# Patient Record
Sex: Male | Born: 1980 | Race: Black or African American | Hispanic: No | Marital: Married | Smoking: Current every day smoker
Health system: Southern US, Community
[De-identification: ages and names within clinical notes are randomized; demographics above are authoritative.]

---

## 2012-06-14 ENCOUNTER — Emergency Department (HOSPITAL_COMMUNITY)
Admission: EM | Admit: 2012-06-14 | Discharge: 2012-06-14 | Disposition: A | Discharge status: Home / Self Care | Payer: Self-pay | Attending: Emergency Medicine | Admitting: Emergency Medicine

## 2012-06-14 ENCOUNTER — Encounter (HOSPITAL_COMMUNITY): Payer: Self-pay | Admitting: Emergency Medicine

## 2012-06-14 DIAGNOSIS — S46909A Unspecified injury of unspecified muscle, fascia and tendon at shoulder and upper arm level, unspecified arm, initial encounter: Secondary | ICD-10-CM | POA: Insufficient documentation

## 2012-06-14 DIAGNOSIS — S4980XA Other specified injuries of shoulder and upper arm, unspecified arm, initial encounter: Secondary | ICD-10-CM | POA: Insufficient documentation

## 2012-06-14 DIAGNOSIS — Y9269 Other specified industrial and construction area as the place of occurrence of the external cause: Secondary | ICD-10-CM | POA: Insufficient documentation

## 2012-06-14 DIAGNOSIS — S20229A Contusion of unspecified back wall of thorax, initial encounter: Secondary | ICD-10-CM | POA: Insufficient documentation

## 2012-06-14 DIAGNOSIS — W1809XA Striking against other object with subsequent fall, initial encounter: Secondary | ICD-10-CM | POA: Insufficient documentation

## 2012-06-14 DIAGNOSIS — R51 Headache: Secondary | ICD-10-CM

## 2012-06-14 DIAGNOSIS — Y9389 Activity, other specified: Secondary | ICD-10-CM | POA: Insufficient documentation

## 2012-06-14 DIAGNOSIS — F17200 Nicotine dependence, unspecified, uncomplicated: Secondary | ICD-10-CM | POA: Insufficient documentation

## 2012-06-14 DIAGNOSIS — S20419A Abrasion of unspecified back wall of thorax, initial encounter: Secondary | ICD-10-CM

## 2012-06-14 DIAGNOSIS — S0990XA Unspecified injury of head, initial encounter: Secondary | ICD-10-CM | POA: Insufficient documentation

## 2012-06-14 DIAGNOSIS — IMO0002 Reserved for concepts with insufficient information to code with codable children: Secondary | ICD-10-CM | POA: Insufficient documentation

## 2012-06-14 DIAGNOSIS — G44309 Post-traumatic headache, unspecified, not intractable: Secondary | ICD-10-CM | POA: Insufficient documentation

## 2012-06-14 MED ORDER — IBUPROFEN 800 MG PO TABS: 800.0000 mg | ORAL_TABLET | Freq: Once | ORAL | Status: DC

## 2012-06-14 MED ORDER — IBUPROFEN 400 MG PO TABS
800.0000 mg | ORAL_TABLET | Freq: Once | ORAL | Status: AC
  Administered 2012-06-14: 800 mg via ORAL
  Filled 2012-06-14: qty 2

## 2012-06-14 MED ORDER — HYDROCODONE-ACETAMINOPHEN 5-325 MG PO TABS: 2.0000 | ORAL_TABLET | Freq: Once | ORAL | Status: DC

## 2012-06-14 MED ORDER — HYDROCODONE-ACETAMINOPHEN 5-325 MG PO TABS
2.0000 | ORAL_TABLET | Freq: Once | ORAL | Status: AC
  Administered 2012-06-14: 2 via ORAL
  Filled 2012-06-14: qty 2
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
## 2012-06-14 NOTE — ED Notes (Signed)
Discharge instructions reviewed. Pt verbalized understanding.  
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
## 2012-09-16 ENCOUNTER — Emergency Department (HOSPITAL_COMMUNITY): Payer: No Typology Code available for payment source

## 2012-09-16 ENCOUNTER — Emergency Department (HOSPITAL_COMMUNITY)
Admission: EM | Admit: 2012-09-16 | Discharge: 2012-09-16 | Disposition: A | Discharge status: Home / Self Care | Payer: No Typology Code available for payment source | Attending: Emergency Medicine | Admitting: Emergency Medicine

## 2012-09-16 ENCOUNTER — Encounter (HOSPITAL_COMMUNITY): Payer: Self-pay | Admitting: *Deleted

## 2012-09-16 DIAGNOSIS — F17200 Nicotine dependence, unspecified, uncomplicated: Secondary | ICD-10-CM | POA: Insufficient documentation

## 2012-09-16 DIAGNOSIS — S161XXA Strain of muscle, fascia and tendon at neck level, initial encounter: Secondary | ICD-10-CM

## 2012-09-16 DIAGNOSIS — S139XXA Sprain of joints and ligaments of unspecified parts of neck, initial encounter: Secondary | ICD-10-CM | POA: Insufficient documentation

## 2012-09-16 DIAGNOSIS — IMO0002 Reserved for concepts with insufficient information to code with codable children: Secondary | ICD-10-CM | POA: Insufficient documentation

## 2012-09-16 DIAGNOSIS — Y9389 Activity, other specified: Secondary | ICD-10-CM | POA: Insufficient documentation

## 2012-09-16 DIAGNOSIS — Y92410 Unspecified street and highway as the place of occurrence of the external cause: Secondary | ICD-10-CM | POA: Insufficient documentation

## 2012-09-16 DIAGNOSIS — S39012A Strain of muscle, fascia and tendon of lower back, initial encounter: Secondary | ICD-10-CM

## 2012-09-16 MED ORDER — TRAMADOL HCL 50 MG PO TABS: 50.0000 mg | ORAL_TABLET | Freq: Four times a day (QID) | ORAL | Status: DC | PRN

## 2012-09-16 MED ORDER — CYCLOBENZAPRINE HCL 10 MG PO TABS: 10.0000 mg | ORAL_TABLET | Freq: Two times a day (BID) | ORAL | Status: DC | PRN
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
## 2012-09-16 NOTE — ED Notes (Signed)
Patient transported to X-ray 

## 2012-09-16 NOTE — ED Notes (Signed)
Pt refused to get off of his cell phone when trying to conduct a more thorough assessment.

## 2012-10-28 ENCOUNTER — Encounter (HOSPITAL_COMMUNITY): Payer: Self-pay | Admitting: Emergency Medicine

## 2012-10-28 ENCOUNTER — Emergency Department (HOSPITAL_COMMUNITY)
Admission: EM | Admit: 2012-10-28 | Discharge: 2012-10-28 | Disposition: A | Discharge status: Home / Self Care | Payer: Self-pay | Attending: Emergency Medicine | Admitting: Emergency Medicine

## 2012-10-28 DIAGNOSIS — K029 Dental caries, unspecified: Secondary | ICD-10-CM | POA: Insufficient documentation

## 2012-10-28 DIAGNOSIS — K047 Periapical abscess without sinus: Secondary | ICD-10-CM | POA: Insufficient documentation

## 2012-10-28 DIAGNOSIS — F17200 Nicotine dependence, unspecified, uncomplicated: Secondary | ICD-10-CM | POA: Insufficient documentation

## 2012-10-28 DIAGNOSIS — K0381 Cracked tooth: Secondary | ICD-10-CM | POA: Insufficient documentation

## 2012-10-28 DIAGNOSIS — R229 Localized swelling, mass and lump, unspecified: Secondary | ICD-10-CM | POA: Insufficient documentation

## 2012-10-28 MED ORDER — PENICILLIN V POTASSIUM 500 MG PO TABS: 500.0000 mg | ORAL_TABLET | Freq: Four times a day (QID) | ORAL | Status: AC

## 2012-10-28 MED ORDER — IBUPROFEN 800 MG PO TABS: 800.0000 mg | ORAL_TABLET | Freq: Three times a day (TID) | ORAL | Status: DC | PRN

## 2012-10-28 MED ORDER — HYDROCODONE-ACETAMINOPHEN 5-325 MG PO TABS
1.0000 | ORAL_TABLET | Freq: Once | ORAL | Status: AC
  Administered 2012-10-28: 1 via ORAL
  Filled 2012-10-28: qty 1

## 2012-10-28 MED ORDER — HYDROCODONE-ACETAMINOPHEN 5-325 MG PO TABS: 1.0000 | ORAL_TABLET | ORAL | Status: DC | PRN
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
## 2012-10-28 NOTE — ED Notes (Signed)
Pt started with right upper molar pain yesterday. States has broken tooth. Right face with swelling and pain.

## 2012-10-29 NOTE — ED Provider Notes (Signed)
Medical screening examination/treatment/procedure(s) were performed by non-physician practitioner and as supervising physician I was immediately available for consultation/collaboration.   Carleene Cooper III, MD 10/29/12 2002

## 2013-07-21 ENCOUNTER — Encounter (HOSPITAL_COMMUNITY): Payer: Self-pay | Admitting: Emergency Medicine

## 2013-07-21 ENCOUNTER — Emergency Department (HOSPITAL_COMMUNITY)
Admission: EM | Admit: 2013-07-21 | Discharge: 2013-07-21 | Disposition: A | Discharge status: Home / Self Care | Payer: Self-pay | Attending: Emergency Medicine | Admitting: Emergency Medicine

## 2013-07-21 ENCOUNTER — Emergency Department (HOSPITAL_COMMUNITY): Payer: Self-pay

## 2013-07-21 DIAGNOSIS — S99919A Unspecified injury of unspecified ankle, initial encounter: Principal | ICD-10-CM

## 2013-07-21 DIAGNOSIS — S99929A Unspecified injury of unspecified foot, initial encounter: Principal | ICD-10-CM

## 2013-07-21 DIAGNOSIS — S8990XA Unspecified injury of unspecified lower leg, initial encounter: Secondary | ICD-10-CM | POA: Insufficient documentation

## 2013-07-21 DIAGNOSIS — F17200 Nicotine dependence, unspecified, uncomplicated: Secondary | ICD-10-CM | POA: Insufficient documentation

## 2013-07-21 DIAGNOSIS — M25569 Pain in unspecified knee: Secondary | ICD-10-CM

## 2013-07-21 MED ORDER — KETOROLAC TROMETHAMINE 60 MG/2ML IM SOLN
60.0000 mg | Freq: Once | INTRAMUSCULAR | Status: AC
  Administered 2013-07-21: 60 mg via INTRAMUSCULAR
  Filled 2013-07-21: qty 2

## 2013-07-21 NOTE — ED Notes (Signed)
Patient transported to X-ray 

## 2013-07-21 NOTE — ED Provider Notes (Signed)
Medical screening examination/treatment/procedure(s) were performed by non-physician practitioner and as supervising physician I was immediately available for consultation/collaboration.  EKG Interpretation   None         Shanna CiscoMegan E Docherty, MD 07/21/13 1300
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
## 2013-07-21 NOTE — Discharge Instructions (Signed)
Rest, ice and elevate your knee. Take ibuprofen, 600-800 mg every 6-8 hours for pain.  Knee Bracing Knee braces are supports to help stabilize and protect an injured or painful knee. They come in many different styles. They should support and protect the knee without increasing the chance of other injuries to yourself or others. It is important not to have a false sense of security when using a brace. Knee braces that help you to keep using your knee:  Do not restore normal knee stability under high stress forces.  May decrease some aspects of athletic performance. Some of the different types of knee braces are:  Prophylactic knee braces are designed to prevent or reduce the severity of knee injuries during sports that make injury to the knee more likely.  Rehabilitative knee braces are designed to allow protected motion of:  Injured knees.  Knees that have been treated with or without surgery. There is no evidence that the use of a supportive knee brace protects the graft following a successful anterior cruciate ligament (ACL) reconstruction. However, braces are sometimes used to:   Protect injured ligaments.  Control knee movement during the initial healing period. They may be used as part of the treatment program for the various injured ligaments or cartilage of the knee including the:  Anterior cruciate ligament.  Medial collateral ligament.  Medial or lateral cartilage (meniscus).  Posterior cruciate ligament.  Lateral collateral ligament. Rehabilitative knee braces are most commonly used:  During crutch-assisted walking right after injury.  During crutch-assisted walking right after surgery to repair the cartilage and/or cruciate ligament injury.  For a short period of time, 2-8 weeks, after the injury or surgery. The value of a rehabilitative brace as opposed to a cast or splint includes the:  Ability to adjust the brace for swelling.  Ability to remove the brace  for examinations, icing or showering.  Ability to allow for movement in a controlled range of motion. Functional knee braces give support to knees that have already been injured. They are designed to provide stability for the injured knee and provide protection after repair. Functional knee braces may not affect performance much. Lower extremity muscle strengthening, flexibility, and improvement in technique are more important than bracing in treating ligamentous knee injuries. Functional braces are not a substitute for rehabilitation or surgical procedures. Unloader/offloader braces are designed to provide pain relief in arthritic knees. Patients with wear and tear arthritis from growing old or from an old cartilage injury (osteoarthritis) of the knee, and bow legged (varus) or knock knee (valgus) deformities, often develop increased pain in the arthritic side due to increased loading. Unloader/offloader braces are made to reduce uneven loading in such knees. There is reduction in bowing out movement in bow legged knees when the correct unloader brace is used. Patients with advanced osteoarthritis or severe varus or valgus alignment problems would not likely benefit from bracing. Patellofemoral braces help the kneecap to move smoothly and well centered over the end of the femur in the knee.  Most people who wear knee braces feel that they help. However, there is a lack of scientific evidence that knee braces are helpful at the level needed for athletic participation to prevent injury. In spite of this, athletes report an increase in knee stability, pain relief, performance improvement, and confidence during athletics when using a brace.  Different knee problems require different knee braces:  Your caregiver may suggest one kind of knee brace after knee surgery.  A caregiver may choose  another kind of knee brace for support instead of surgery for some types of torn ligaments.  You may also need one for  pain in the front of your knee that is not getting better with strengthening and flexibility exercises. Get your caregiver's advice if you want to try a knee brace. The caregiver will advise you on where to get them and provide a prescription when it is needed to fashion and/or fit the brace. Knee braces are the least important part of preventing knee injuries or getting better following injury. Stretching, strengthening and technique improvement are far more important in caring for and preventing knee injuries. When strengthening your knee, increase your activities a little at a time so as not to develop injuries from over use. Work out an exercise plan with your caregiver and/or physical therapist to get the best program for you. Do not let a knee brace become a crutch. Always remember, there are no braces which support the knee as well as your original ligaments and cartilage you were born with. Conditioning, proper warm-up and stretching remain the most important parts of keeping your knees healthy. HOW TO USE A KNEE BRACE  During sports, knee braces should be used as directed by your caregiver.  Make sure that the hinges are where the knee bends.  Straps, tapes, or hook-and-loop tapes should be fastened around your leg as instructed.  You should check the placement of the brace during activities to make sure that it has not moved. Poorly positioned braces can hurt rather than help you.  To work well, a knee brace should be worn during all activities that put you at risk of knee injury.  Warm up properly before beginning athletic activities. HOME CARE INSTRUCTIONS  Knee braces often get damaged during normal use. Replace worn-out braces for maximum benefit.  Clean regularly with soap and water.  Inspect your brace often for wear and tear.  Cover exposed metal to protect others from injury.  Durable materials may cost more, but last longer. SEEK IMMEDIATE MEDICAL CARE IF:   Your knee  seems to be getting worse rather than better.  You have increasing pain or swelling in the knee.  You have problems caused by the knee brace.  You have increased swelling or inflammation (redness or soreness) in your knee.  Your knee becomes warm and more painful and you develop an unexplained temperature over 101 F (38.3 C). MAKE SURE YOU:   Understand these instructions.  Will watch your condition.  Will get help right away if you are not doing well or get worse. See your caregiver, physical therapist or orthopedic surgeon for additional information. Document Released: 09/13/2003 Document Revised: 09/15/2011 Document Reviewed: 12/20/2008 [facility] Patient Information 2014 [place], [place].  Knee Pain Knee pain can be a result of an injury or other medical conditions. Treatment will depend on the cause of your pain. HOME CARE  Only take medicine as told by your doctor.  Keep a healthy weight. Being overweight can make the knee hurt more.  Stretch before exercising or playing sports.  If there is constant knee pain, change the way you exercise. Ask your doctor for advice.  Make sure shoes fit well. Choose the right shoe for the sport or activity.  Protect your knees. Wear kneepads if needed.  Rest when you are tired. GET HELP RIGHT AWAY IF:   Your knee pain does not stop.  Your knee pain does not get better.  Your knee joint feels hot to  the touch.  You have a fever. MAKE SURE YOU:   Understand these instructions.  Will watch this condition.  Will get help right away if you are not doing well or get worse. Document Released: 09/19/2008 Document Revised: 09/15/2011 Document Reviewed: 09/19/2008 [facility] Patient Information 2014 [place], [place]

## 2013-07-21 NOTE — ED Notes (Signed)
Bed: [id number] Expected date:  Expected time:  Means of arrival:  Comments: EMS-knee pain/fall

## 2014-10-22 IMAGING — CR DG KNEE COMPLETE 4+V*L*
5 series · 5 of 5 positions shown · non-contrast
Comparison: None.

CLINICAL DATA: Pain post trauma

EXAM:
LEFT KNEE - COMPLETE 4+ VIEW

[t knee lat left (1 of 2)]
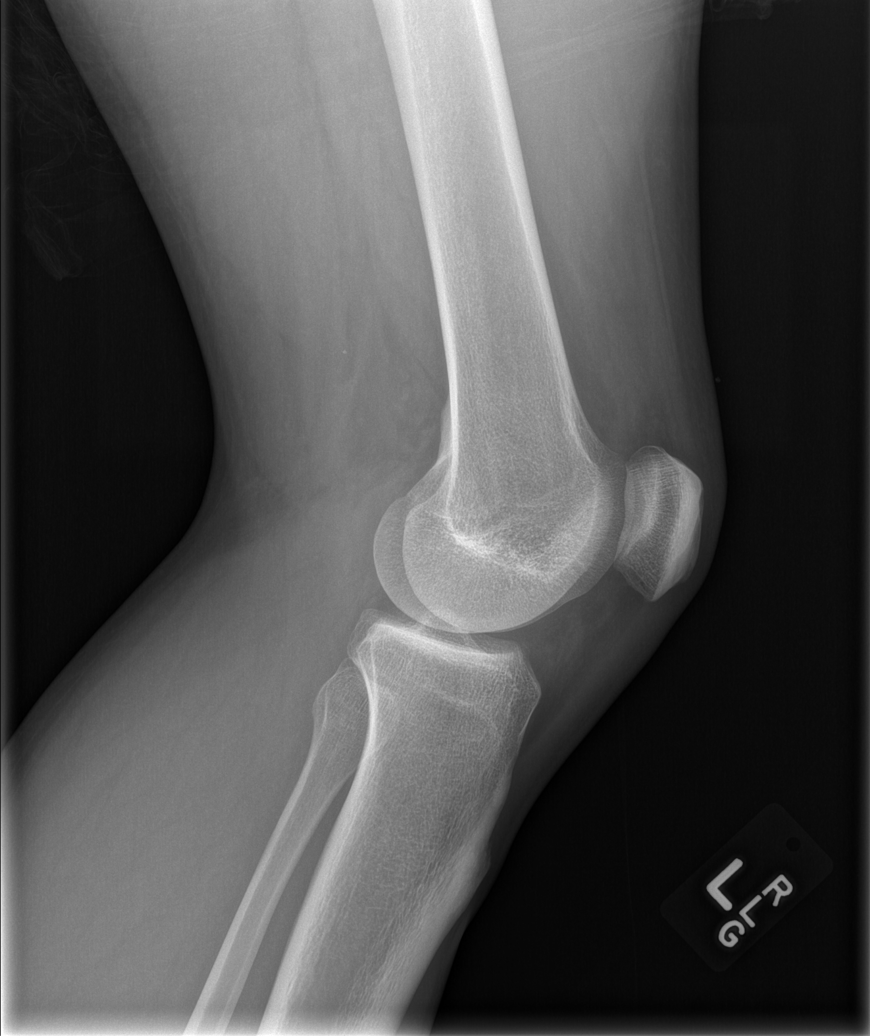

[t knee lat left (2 of 2)]
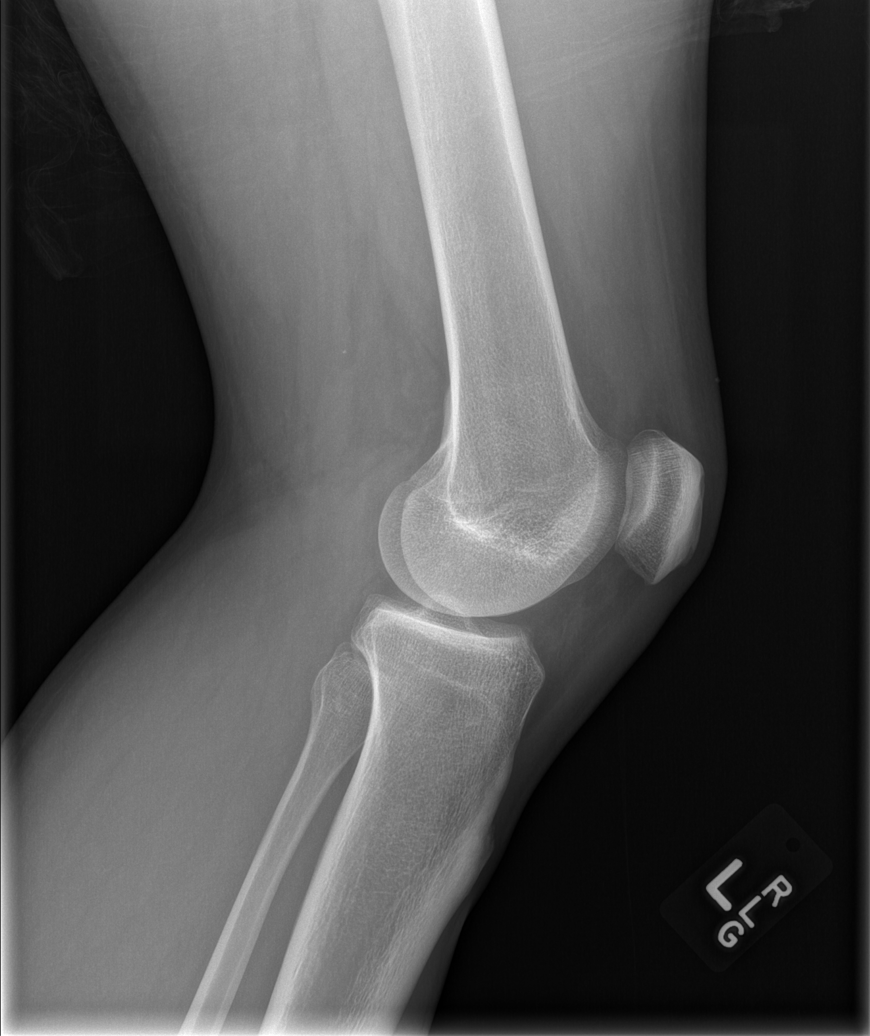

[t knee obl left (1 of 2)]
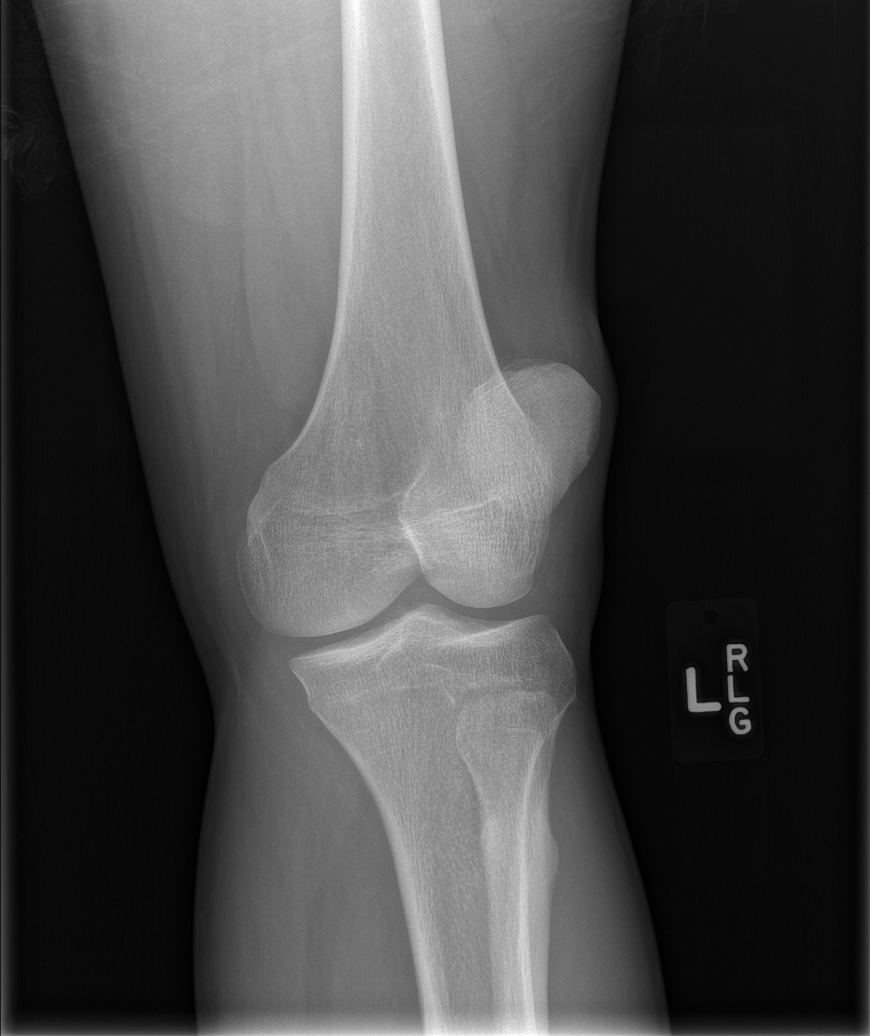

[t knee ap left]
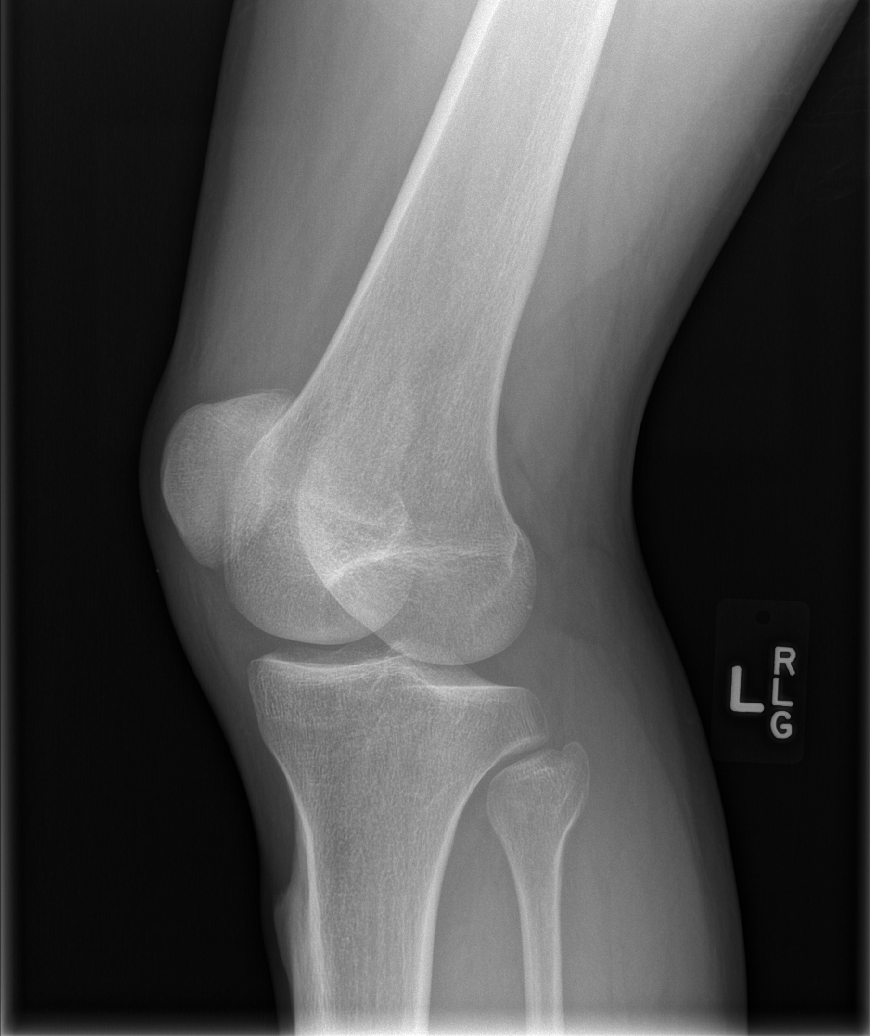

[t knee obl left (2 of 2)]
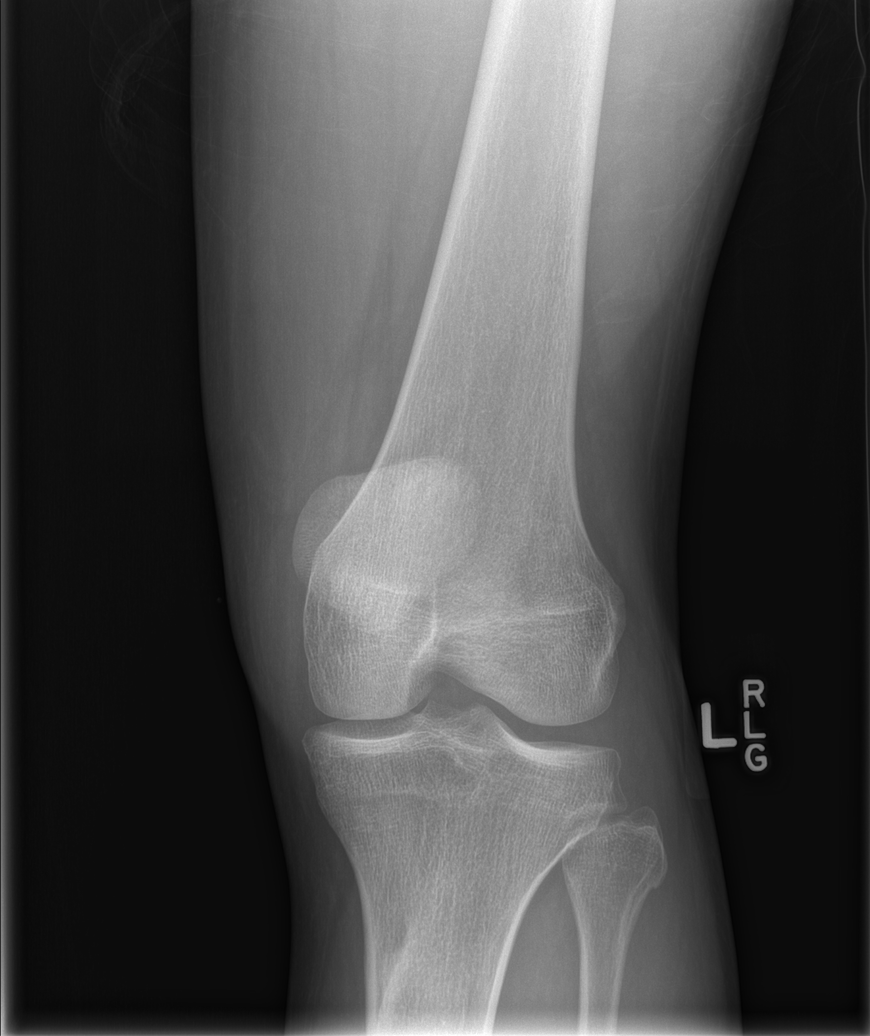

[5 of 5 positions shown; findings below may reference images not displayed]

FINDINGS: Frontal, lateral, and bilateral oblique views were obtained. There
is no fracture, dislocation, or effusion. Joint spaces appear
intact. No erosive change.
IMPRESSION: No abnormality noted.
